# Patient Record
Sex: Female | Born: 1975 | Race: White | Hispanic: Yes | Marital: Married | Smoking: Never smoker
Health system: Southern US, Community
[De-identification: ages and names within clinical notes are randomized; demographics above are authoritative.]

---

## 2006-05-30 ENCOUNTER — Ambulatory Visit (HOSPITAL_COMMUNITY): Payer: Self-pay | Admitting: Family Medicine

## 2006-08-29 ENCOUNTER — Ambulatory Visit: Payer: Self-pay | Admitting: Family Medicine

## 2006-08-29 ENCOUNTER — Ambulatory Visit: Payer: Self-pay | Admitting: *Deleted

## 2006-09-26 ENCOUNTER — Ambulatory Visit: Payer: Self-pay | Admitting: Family Medicine

## 2006-10-26 ENCOUNTER — Encounter (INDEPENDENT_AMBULATORY_CARE_PROVIDER_SITE_OTHER): Payer: Self-pay | Admitting: *Deleted

## 2006-10-26 ENCOUNTER — Ambulatory Visit: Payer: Self-pay | Admitting: Family Medicine

## 2006-12-29 ENCOUNTER — Ambulatory Visit: Payer: Self-pay | Admitting: Gynecology

## 2006-12-29 ENCOUNTER — Other Ambulatory Visit: Admission: RE | Admit: 2006-12-29 | Discharge: 2006-12-29 | Payer: Self-pay | Admitting: Gynecology

## 2007-01-10 ENCOUNTER — Ambulatory Visit: Payer: Self-pay | Admitting: Obstetrics & Gynecology

## 2007-02-09 ENCOUNTER — Ambulatory Visit: Payer: Self-pay | Admitting: Family Medicine

## 2007-03-19 ENCOUNTER — Ambulatory Visit: Payer: Self-pay | Admitting: Family Medicine

## 2007-07-27 DIAGNOSIS — R1013 Epigastric pain: Secondary | ICD-10-CM

## 2007-07-27 DIAGNOSIS — Z8679 Personal history of other diseases of the circulatory system: Secondary | ICD-10-CM | POA: Insufficient documentation

## 2007-07-27 DIAGNOSIS — K3189 Other diseases of stomach and duodenum: Secondary | ICD-10-CM

## 2007-07-27 DIAGNOSIS — R8789 Other abnormal findings in specimens from female genital organs: Secondary | ICD-10-CM | POA: Insufficient documentation

## 2007-08-08 ENCOUNTER — Encounter (INDEPENDENT_AMBULATORY_CARE_PROVIDER_SITE_OTHER): Payer: Self-pay | Admitting: *Deleted

## 2007-08-10 ENCOUNTER — Other Ambulatory Visit: Payer: Self-pay | Admitting: Obstetrics & Gynecology

## 2007-08-10 ENCOUNTER — Emergency Department (HOSPITAL_COMMUNITY): Admission: EM | Admit: 2007-08-10 | Discharge: 2007-08-11 | Payer: Self-pay | Admitting: Emergency Medicine

## 2007-08-10 ENCOUNTER — Other Ambulatory Visit: Payer: Self-pay | Admitting: Obstetrics and Gynecology

## 2007-08-12 ENCOUNTER — Emergency Department (HOSPITAL_COMMUNITY): Admission: EM | Admit: 2007-08-12 | Discharge: 2007-08-12 | Payer: Self-pay | Admitting: Emergency Medicine

## 2007-08-14 ENCOUNTER — Emergency Department (HOSPITAL_COMMUNITY): Admission: EM | Admit: 2007-08-14 | Discharge: 2007-08-15 | Payer: Self-pay | Admitting: Emergency Medicine

## 2007-08-15 ENCOUNTER — Ambulatory Visit (HOSPITAL_COMMUNITY): Admission: RE | Admit: 2007-08-15 | Discharge: 2007-08-15 | Payer: Self-pay | Admitting: Obstetrics & Gynecology

## 2007-08-29 ENCOUNTER — Ambulatory Visit (HOSPITAL_COMMUNITY): Admission: RE | Admit: 2007-08-29 | Discharge: 2007-08-29 | Payer: Self-pay | Admitting: Obstetrics & Gynecology

## 2007-12-30 ENCOUNTER — Inpatient Hospital Stay (HOSPITAL_COMMUNITY): Admission: AD | Admit: 2007-12-30 | Discharge: 2007-12-30 | Payer: Self-pay | Admitting: Obstetrics & Gynecology

## 2007-12-30 ENCOUNTER — Ambulatory Visit: Payer: Self-pay | Admitting: Obstetrics & Gynecology

## 2008-01-18 ENCOUNTER — Ambulatory Visit: Payer: Self-pay | Admitting: Obstetrics & Gynecology

## 2008-01-25 ENCOUNTER — Inpatient Hospital Stay (HOSPITAL_COMMUNITY): Admission: RE | Admit: 2008-01-25 | Discharge: 2008-01-28 | Payer: Self-pay | Admitting: Obstetrics & Gynecology

## 2008-01-25 ENCOUNTER — Ambulatory Visit: Payer: Self-pay | Admitting: Physician Assistant

## 2009-12-25 ENCOUNTER — Encounter (INDEPENDENT_AMBULATORY_CARE_PROVIDER_SITE_OTHER): Payer: Self-pay | Admitting: *Deleted

## 2009-12-31 ENCOUNTER — Ambulatory Visit: Payer: Self-pay | Admitting: Internal Medicine

## 2009-12-31 ENCOUNTER — Encounter (INDEPENDENT_AMBULATORY_CARE_PROVIDER_SITE_OTHER): Payer: Self-pay | Admitting: *Deleted

## 2010-12-21 NOTE — Letter (Signed)
Summary: New Patient Letter  [facility] [place]  [address] [place], [place] [address]   Phone: [phone]  Fax: [phone]       12/25/2009 MRN: [id number]  Laurie Guerrero [address] [place], [place]  [address]  Dear Ms. [name],   Welcome to [facility] and thank you for choosing [place] as your Primary Care Providers. Enclosed you will find information about our practice that we hope you find helpful. We have also enclosed forms to be filled out prior to your visit. This will provide [place] with the necessary information and facilitate your being seen in a timely manner. If you have any questions, please call us at:  [phone]        and we will be happy to assist you. We look forward to seeing you at your scheduled appointment time.  Appointment   Feb 10,2011 @ 8:40am            with Dr.  Drue Novel               Sincerely,  Primary Health Care Team  Please arrive 15 minutes early for your first appointment and bring your insurance card. Co-pay is required at the time of your visit.  *****Please call the office if you are not able to keep this appointment. There is a charge of $50.00 if any appointment is not cancelled or rescheduled within 24 hours.

## 2010-12-21 NOTE — Letter (Signed)
Summary: [place] No Show Letter  [facility] [place]  [address] [place], [place] [address]   Phone: [phone]  Fax: [phone]    12/31/2009 MRN: [id number]  Laurie Guerrero [address] [place], [place]  [address]   Dear Ms. [name],   Our records indicate that you missed your scheduled appointment with Dr. Drue Novel on 12/31/09.  Please contact this office to reschedule your appointment as soon as possible.  It is important that you keep your scheduled appointments with your physician, so we can provide you the best care possible.  Please be advised that there may be a charge for "no show" appointments.    Sincerely,   [facility]

## 2011-08-15 LAB — CBC
HCT: 40.2
MCHC: 33
MCHC: 33.3
MCV: 78.6
Platelets: 165
Platelets: 166
RBC: 5.11
RDW: 16.1 — ABNORMAL HIGH
WBC: 7.9

## 2011-08-15 LAB — RPR: RPR Ser Ql: NONREACTIVE

## 2011-09-01 LAB — BASIC METABOLIC PANEL
Calcium: 9.3
Chloride: 104
Creatinine, Ser: 0.44
Creatinine, Ser: 0.52
GFR calc Af Amer: >60
Glucose, Bld: 87
Glucose, Bld: 101 — ABNORMAL HIGH
Potassium: 4
Potassium: 3.3 — ABNORMAL LOW
Sodium: 135

## 2011-09-01 LAB — CSF CELL COUNT WITH DIFFERENTIAL
Eosinophils, CSF: 0
Other Cells, CSF: 0
RBC Count, CSF: 1 — ABNORMAL HIGH
Tube #: 1
Tube #: 4

## 2011-09-01 LAB — I-STAT 8, (EC8 V) (CONVERTED LAB)
Acid-base deficit: 3 — ABNORMAL HIGH
BUN: 5 — ABNORMAL LOW
Bicarbonate: 22.8
Potassium: 3.8
Sodium: 138
TCO2: 24
pH, Ven: 7.349 — ABNORMAL HIGH

## 2011-09-01 LAB — GRAM STAIN

## 2011-09-01 LAB — URINE MICROSCOPIC-ADD ON

## 2011-09-01 LAB — DIFFERENTIAL
Basophils Absolute: 0
Eosinophils Absolute: 0.2
Eosinophils Absolute: 0.2
Eosinophils Relative: 3
Lymphocytes Relative: 16
Monocytes Relative: 7
Monocytes Relative: 8
Neutro Abs: 5.6
Neutro Abs: 6.5
Neutrophils Relative %: 70

## 2011-09-01 LAB — CBC
HCT: 34.8 — ABNORMAL LOW
HCT: 32.6 — ABNORMAL LOW
Hemoglobin: 11.6 — ABNORMAL LOW
MCHC: 33.4
MCHC: 32.7
Platelets: 230
RBC: 4.43
RBC: 4.53
WBC: 8.2
WBC: 8.6

## 2011-09-01 LAB — URINALYSIS, ROUTINE W REFLEX MICROSCOPIC
Bilirubin Urine: NEGATIVE
Ketones, ur: NEGATIVE
Ketones, ur: NEGATIVE
Nitrite: NEGATIVE
Protein, ur: NEGATIVE
Protein, ur: NEGATIVE
Urobilinogen, UA: 0.2
Urobilinogen, UA: 0.2
pH: 7
pH: 7.5

## 2011-09-01 LAB — CSF CULTURE W GRAM STAIN: Culture: NO GROWTH

## 2011-09-01 LAB — URINE CULTURE: Culture: NO GROWTH

## 2011-09-01 LAB — PROTEIN AND GLUCOSE, CSF: Total  Protein, CSF: 33

## 2012-02-21 ENCOUNTER — Inpatient Hospital Stay (HOSPITAL_COMMUNITY): Payer: Self-pay

## 2015-02-21 ENCOUNTER — Emergency Department (HOSPITAL_COMMUNITY)
Admission: EM | Admit: 2015-02-21 | Discharge: 2015-02-21 | Disposition: A | Discharge status: Home / Self Care | Payer: Medicaid Other | Attending: Emergency Medicine | Admitting: Emergency Medicine

## 2015-02-21 ENCOUNTER — Encounter (HOSPITAL_COMMUNITY): Payer: Self-pay | Admitting: *Deleted

## 2015-02-21 ENCOUNTER — Emergency Department (HOSPITAL_COMMUNITY): Payer: Medicaid Other

## 2015-02-21 DIAGNOSIS — M545 Low back pain, unspecified: Secondary | ICD-10-CM

## 2015-02-21 DIAGNOSIS — Y9389 Activity, other specified: Secondary | ICD-10-CM | POA: Diagnosis not present

## 2015-02-21 DIAGNOSIS — Y92410 Unspecified street and highway as the place of occurrence of the external cause: Secondary | ICD-10-CM | POA: Diagnosis not present

## 2015-02-21 DIAGNOSIS — S3992XA Unspecified injury of lower back, initial encounter: Secondary | ICD-10-CM | POA: Insufficient documentation

## 2015-02-21 DIAGNOSIS — Y998 Other external cause status: Secondary | ICD-10-CM | POA: Insufficient documentation

## 2015-02-21 MED ORDER — DIAZEPAM 5 MG PO TABS
5.0000 mg | ORAL_TABLET | Freq: Once | ORAL | Status: AC
Start: 2015-02-21 — End: 2015-02-21
  Administered 2015-02-21: 5 mg via ORAL
  Filled 2015-02-21: qty 1

## 2015-02-21 MED ORDER — HYDROCODONE-ACETAMINOPHEN 5-325 MG PO TABS: 2.0000 | ORAL_TABLET | ORAL | Status: AC | PRN

## 2015-02-21 MED ORDER — METHOCARBAMOL 500 MG PO TABS: 500.0000 mg | ORAL_TABLET | Freq: Two times a day (BID) | ORAL | Status: AC | PRN

## 2015-02-21 MED ORDER — NAPROXEN 500 MG PO TABS: 500.0000 mg | ORAL_TABLET | Freq: Two times a day (BID) | ORAL | Status: AC

## 2015-02-21 MED ORDER — HYDROCODONE-ACETAMINOPHEN 5-325 MG PO TABS
1.0000 | ORAL_TABLET | Freq: Once | ORAL | Status: AC
  Administered 2015-02-21: 1 via ORAL

## 2015-02-21 MED ORDER — KETOROLAC TROMETHAMINE 60 MG/2ML IM SOLN
60.0000 mg | Freq: Once | INTRAMUSCULAR | Status: AC
  Administered 2015-02-21: 60 mg via INTRAMUSCULAR
  Filled 2015-02-21: qty 2
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
## 2015-02-21 NOTE — ED Notes (Signed)
Pt states she is still having pain while moving form gurney to wheelchair. Pt's family is requesting x-ray prior to d/c because of pt's pain. PA aware.
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
## 2015-03-11 ENCOUNTER — Encounter (HOSPITAL_COMMUNITY): Payer: Self-pay

## 2015-03-11 NOTE — Ancillary Notes (Signed)
[facility] Record for: Lauren, [name]  Created: 02/23/2015 8:20:06 AM  MRN: [id number]  DOB: Apr 13, 1976  SSN: [id number]  Sex: Female  Height:  Feet  Inches - Weight:   [name] Name:   Address:   [name]  CityStZip: [place], [place] [address]  E-Mail:   Day Phone: [phone] Night Phone:  Other Phone: [phone]  Phone comments:   Call Back time:   Authorized contact:   Intake Date: 02/23/2015 8:20:06 AM  PCP:    RefMD: Self , Referral   Primary Insurance: [employer]  Secondary Insurance:   Insurance Comments:      -------Referral Assignment------  Where was the original referral directed?: [facility]  Was a specific reviewer requested?: Unassigned Referral  How was the reviewer selected?: Rotation  Who requested the specific reviewer?:   How did you hear of our spine program?:   Is this a second opinion?:      -------Merchandiser, retail----------  [employer]:   City of Birth:      ---------- 1st Review ----------     Review Date:   Review Completed by:   Impression:   Disposition:   Appt w/ Colleague:   Colleague Name:   Appt How Soon:   Pre Treatment Type:   Pre Treatment Type Details:   Pre Treatment Other Test:   Appt Type:   Instructions:      ---------- Symptoms ----------     Chief complaint:   Diagnosis from Other MD:      Symptoms:   Other Symptom Description:   Pain Location:   Other Pain Location Description:   Where is your pain the worst?:   Pain Type:   Pain Rating:   Does the pain radiate to other parts of your body?    Radiate Where:   Other Description:   Does it radiate to the fingers?   Does it radiate below the elbow?   Which specific part of your arm?   Which fingers?   Which part of your arm does pain go to?   How does it radiate to the arm?   Does it radiate to the toes?   Does it radiate below the knee?   Which specific part of your leg?   Which toes?   Which part of your leg does pain go to?   How does it radiate to the leg?   Additional pain information      Location of  Numbness/tingling:   Other Description:  Does numbness/tingling radiate to other parts of your body?:  Where does the numbess/tingling radiate?:  Other Description:  Does the numbness/tingling radiate below your elbow?:  Which specific part of your arm?:  Which fingers:  Which pat of your arm does the numbness/tingling go to?:  How does the numbness/tingling radiate to your arm?:  Does the numbness/tingling radiate below your knee?:  Which specific part of your leg?:  Which toes:  Which part of your leg does the numbness/tingling go to?:  How does the numbness/tingling radiate to your leg?:  Additional Numbness/tingling information:  Other Description:      Location of Weakness:   Other Description:   Additional Weakness Information:      The symptoms have been present for:   The symptoms began:   Was there a specific event that caused your symptoms?:   Additional Narrative Description:   Are you able to perform your daily activities with these symptoms?:   Since what date have  you been unable to perform your daily routine?:   The symptoms improve when you:   Other activities that improve your symptoms:   The symptoms worsen when you:   Other activities that worsen your symptoms:      ---------- Work History ----------     Are you able to work?:   Reasons for not working:   Other Reason for not working:   Do you have Work Restrictions:   If applicable, maximum lifting restriction:   How long have you been unable to work:   Have you ever filed a W/C claim related to a neck or back injury?:   Occupation:   Other Occupation Information:      ---------- Bowel/Bladder/Incontinence Issues ----------     Since the onset of symptoms, have you experienced any new problems urinating or having bowel movements?:   Description:   Other Description:   How long have you had these bowel/bladder problems?:      ---------- Allergies ----------     Do you have any medication allergies?   Allergies:   Other Details:   Allergic to  Latex?:   Allergic to intravenous contrast dyes?:   Allergic to steroids?:      ---------- Treatment/Testing ----------      Taking prescription medication for this problem?:   Are you using any now?:   Have you received a Medrol dose pack for this problem?:   When did you last take the dosepack?:   Were your symptoms improved?:   Would you describe your relief as:   How long did you experience that amount of relief?:   Other Medrol dosepack information:      --------------- PT ---------------     PT:   When Received:   Where Received:   Other where received information:   Visits:   Types:   Other Types:   Improved:      If improved, describe level of relief:   If improved, how long did you experience relief:   Other Physical Therapy Treatment Information:      ---------- Chiropractic Services ----------     Chiro:   When:   Who:   Visits:   Types:   Other Types:   Were Symptoms Improved:   If improved, describe level of relief:   If improved, how long did you experience relief:   Other Chiropractic Treatment Information:      --------------- ESI ---------------     ESI:   When:   Who:   Visits:   Types:   Improved:   If improved, describe level of relief:   If improved, how long did you experience relief:   Other Injection Treatment Information:      ---------- Diagnostic Tests ----------     Do you have any of the following in case an MRI is ordered?:   Other MRI factors:      ---------- Past Medical History ----------     What other doctors/providers have treated you for these spine issues?     Ever diagnosed with Spine deformity?:   Prior neck or back surgery (1)?:   When:   Who:   Area of neck/spine operated on:   Level:   Were symptoms improved?:   If improved, describe level of relief:  If improved, how long did you experience relief?:   Prior neck or back surgery (2)?:   When:   Who:   Area of neck/spine operated on:   Level:   Were  symptoms improved?:   If improved, describe level of relief:  If improved,  how long did you experience relief?:   Prior neck or back surgery (3)?:   When:   Who:   Area of neck/spine operated on:   Level:   Were symptoms improved?:   If improved, describe level of relief:  If improved, how long did you experience relief?:   Do you have any of the following assistive devices?   How long have you required these assistive devices?   Currently being treated for any other medical condition?:   Conditions:   Other Conditions:   Type of neurologic disorder:   Cancer Type:   Other Treatment/Medication:   What blood thinners are you currently taking?:   Which physicians are treating you for your other medical conditions?:   Do you smoke?:   Are you pre-menopausal or post-menopausal?:   If recommended, are you willing to consider surgery?:   What is your goal in seeking treatment?:   Other pertinent information/ general comments/ goals for treatment:      ---------- Care Coordinator Information ----------     Care Coordinator:   Activity Log:      Letter/Test Coordinator: Letters  Letter/Test Coordinator Log:      Intake Specialist Comments: 02/23/2015 12:38:44 PM [ZOXWRU[Danita Perry]: Attempted to contact patient to obtain his/her medical history. Left message and phone  on answering machine for patient to return our call. Mailed postcard also. DPerry; 02/24/2015 3:48:18 PM [EAVWUJ[Danita Perry]: Tried to contact patient again to obtain his/her medical history. Left message and phone  on answering machine for patient to return our call. DPerry     Clinic Staff Comments:      Last Edited byRaelene Bott: Crystal Tephabock on 03/11/2015 8:07:49 AM  Last Review by:  on
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
## 2016-10-04 ENCOUNTER — Ambulatory Visit (INDEPENDENT_AMBULATORY_CARE_PROVIDER_SITE_OTHER): Payer: Self-pay
